# Patient Record
Sex: Male | Born: 2000 | Race: White | Hispanic: No | Marital: Single | Smoking: Never smoker
Health system: Southern US, Community
[De-identification: ages and names within clinical notes are randomized; demographics above are authoritative.]

## PROBLEM LIST (undated history)

## (undated) DIAGNOSIS — F909 Attention-deficit hyperactivity disorder, unspecified type: Secondary | ICD-10-CM

## (undated) DIAGNOSIS — F840 Autistic disorder: Secondary | ICD-10-CM

## (undated) HISTORY — DX: Autistic disorder: F84.0

## (undated) HISTORY — PX: COSMETIC SURGERY: SHX468

---
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
## 2021-01-03 ENCOUNTER — Other Ambulatory Visit: Payer: Self-pay | Admitting: Pediatrics

## 2021-01-03 DIAGNOSIS — K219 Gastro-esophageal reflux disease without esophagitis: Secondary | ICD-10-CM

## 2021-01-07 ENCOUNTER — Encounter (HOSPITAL_COMMUNITY): Payer: Self-pay

## 2021-01-07 ENCOUNTER — Other Ambulatory Visit: Payer: Self-pay

## 2021-01-07 ENCOUNTER — Ambulatory Visit (HOSPITAL_COMMUNITY)
Admission: EM | Admit: 2021-01-07 | Discharge: 2021-01-07 | Disposition: A | Discharge status: Home / Self Care | Payer: Medicaid Other | Attending: Physician Assistant | Admitting: Physician Assistant

## 2021-01-07 DIAGNOSIS — J069 Acute upper respiratory infection, unspecified: Secondary | ICD-10-CM | POA: Insufficient documentation

## 2021-01-07 DIAGNOSIS — Z20822 Contact with and (suspected) exposure to covid-19: Secondary | ICD-10-CM | POA: Diagnosis not present

## 2021-01-07 DIAGNOSIS — R051 Acute cough: Secondary | ICD-10-CM

## 2021-01-07 DIAGNOSIS — R0981 Nasal congestion: Secondary | ICD-10-CM

## 2021-01-07 DIAGNOSIS — R059 Cough, unspecified: Secondary | ICD-10-CM | POA: Diagnosis present

## 2021-01-07 LAB — POC INFLUENZA A AND B ANTIGEN (URGENT CARE ONLY)
INFLUENZA A ANTIGEN, POC: NEGATIVE
INFLUENZA B ANTIGEN, POC: NEGATIVE

## 2021-01-07 MED ORDER — BENZONATATE 100 MG PO CAPS: 100.0000 mg | ORAL_CAPSULE | Freq: Three times a day (TID) | ORAL | 0 refills | Status: AC

## 2021-01-07 NOTE — Discharge Instructions (Signed)
I believe that you have a virus.  Your flu testing was negative.  We will contact you if your COVID test is positive.  Please remain in isolation until you receive these results.  You can use Tessalon up to 3 times a day as needed for cough.  Use over-the-counter medications such as Tylenol ibuprofen for additional symptom relief.  Make sure you are drinking plenty of fluid.  If you have any worsening symptoms including high fever, severe cough, shortness of breath, nausea/vomiting you need to be reevaluated.  Follow-up with your primary care provider within a week to ensure symptom improvement.

## 2021-01-07 NOTE — ED Triage Notes (Signed)
Pt reports  cough, sneezing, headache and sore throat x 5 days. DayQuil and NyQuil gives no relief.

## 2021-01-07 NOTE — ED Provider Notes (Signed)
[facility]    CSN: [id number] Arrival date & time: 01/07/21  1327      History   Chief Complaint Chief Complaint  Patient presents with   Cough    HPI Kyle Hodge is a 20 y.o. male.   Patient presents today with a 2-day history of URI symptoms.  Reports cough, nasal congestion, headache, sore throat, vomiting.  Denies any chest pain, shortness of breath, nausea, abdominal pain, diarrhea.  Denies any known sick contacts.  Is up-to-date on COVID-19 vaccination but has not had influenza vaccine.  He has not had COVID in the past.  He has tried DayQuil and NyQuil with improvement of symptoms.  Denies any history of asthma or COPD.  Does have a history of allergies but uses as needed over-the-counter medications for symptom management.  He is having difficulty sleeping as result of cough.   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 Authorizing Provider  benzonatate (TESSALON) 100 MG capsule Take 1 capsule (100 mg total) by mouth every 8 (eight) hours. 01/07/21  Yes Zyshawn Bohnenkamp, Noberto Retort, PA-C  buPROPion (WELLBUTRIN XL) 150 MG 24 hr tablet TK 1 T PO D 05/16/17   [provider]  cloNIDine (CATAPRES) 0.2 MG tablet TK 1 T PO D AT 6 PM FOR SLP 09/03/17   [provider]  esomeprazole (NEXIUM) 40 MG capsule TAKE 1 CAPSULE(40 MG) BY MOUTH DAILY 10/10/20   Maree Erie, MD   methylphenidate 36 MG PO CR tablet Take 36 mg by mouth every morning. 10/22/19   [provider]  QUEtiapine (SEROQUEL XR) 400 MG 24 hr tablet SMARTSIG:1 Tablet(s) By Mouth Every Evening 10/22/19   [provider]    Family History Family History  Problem Relation Age of Onset   Seizures Mother    Depression Mother    Seizures Brother        oldest brother   Learning disabilities Brother    Learning disabilities Brother     Social History Social History   Tobacco Use   Smoking status: Never   Smokeless tobacco: Never   Tobacco comments:     foster home 2019  Substance Use Topics   Alcohol use: No    Alcohol/week: 0.0 standard drinks   Drug use: No     Allergies   Benadryl allergy [diphenhydramine] and Other   Review of Systems Review of Systems  Constitutional:  Positive for activity change and fatigue. Negative for appetite change and fever.  HENT:  Positive for congestion, postnasal drip, sinus pain, sneezing and sore throat. Negative for sinus pressure.   Respiratory:  Positive for cough. Negative for shortness of breath.   Cardiovascular:  Negative for chest pain.  Gastrointestinal:  Positive for nausea. Negative for abdominal pain, diarrhea and vomiting.  Musculoskeletal:  Negative for arthralgias and myalgias.  Neurological:  Positive for headaches. Negative for dizziness  and light-headedness.    Physical Exam Triage Vital Signs ED Triage Vitals  Enc Vitals Group     BP 01/07/21 1458 (!) 141/70     Pulse Rate 01/07/21 1458 (!) 103     Resp 01/07/21 1458 18     Temp 01/07/21 1458 98.8 F (37.1 C)     Temp Source 01/07/21 1458 Oral     SpO2 01/07/21 1458 100 %     Weight --      Height --      Head Circumference --      Peak Flow --      Pain Score 01/07/21 1457 9     Pain Loc --      Pain Edu? --      Excl. in GC? --    No data found.  Updated Vital Signs BP (!) 141/70 (BP Location: Left Arm)   Pulse (!) 103   Temp 98.8 F  (37.1 C) (Oral)   Resp 18   SpO2 100%   Visual Acuity Right Eye Distance:   Left Eye Distance:   Bilateral Distance:    Right Eye Near:   Left Eye Near:    Bilateral Near:     Physical Exam Vitals reviewed.  Constitutional:      General: He is awake.     Appearance: Normal appearance. He is well-developed. He is not ill-appearing.     Comments: Very pleasant male appears stated age in no acute distress sitting comfortably in exam room  HENT:     Head: Normocephalic and atraumatic.     Right Ear: Tympanic membrane, ear canal and external ear normal. Tympanic membrane is not erythematous or bulging.     Left Ear: Tympanic membrane, ear canal and external ear normal. Tympanic membrane is not erythematous or bulging.     Nose: Nose normal.     Right Sinus: No maxillary sinus tenderness or frontal sinus tenderness.     Left Sinus: No maxillary sinus tenderness or frontal sinus tenderness.     Mouth/Throat:     Pharynx: Uvula midline. Posterior oropharyngeal erythema present. No oropharyngeal exudate.  Cardiovascular:     Rate and Rhythm: Normal rate and regular rhythm.     Heart sounds: Normal heart sounds, S1 normal and S2 normal. No murmur heard. Pulmonary:     Effort: Pulmonary effort is normal. No accessory muscle usage or respiratory distress.     Breath sounds: Normal breath sounds. No stridor. No wheezing, rhonchi or rales.     Comments: Clear to auscultation bilaterally Abdominal:     General: Bowel sounds are normal.     Palpations: Abdomen is soft.     Tenderness: There is no abdominal tenderness.  Neurological:     Mental Status: He is alert.  Psychiatric:        Behavior: Behavior is cooperative.     UC Treatments / Results  Labs (all labs ordered are listed, but only abnormal results are displayed) Labs Reviewed  SARS CORONAVIRUS 2 (TAT 6-24 HRS)  POC INFLUENZA A AND B ANTIGEN (URGENT CARE ONLY)    EKG   Radiology No results  found.  Procedures Procedures (including critical care time)  Medications Ordered in UC Medications - No data to display  Initial Impression / Assessment and Plan / UC Course  I have reviewed the triage vital signs and the nursing notes.  Pertinent labs & imaging results that were available during my care of the patient were reviewed by  me and considered in my medical decision making (see chart for details).     Likely viral etiology of symptoms given short duration.  No evidence of acute infection on physical exam that would warrant initiation of antibiotics.  Flu testing was negative.  COVID test is pending.  Patient was instructed to remain in isolation until COVID test results are obtained and follow CDC guidelines regarding return to normal activities based on result.  He was given Tessalon for cough.  Recommended use over-the-counter medications for additional symptom relief.  He is to rest and drink plenty of fluid.  Discussed alarm symptoms that warrant emergent evaluation.  Strict return precautions given to which she expressed understanding.  Final Clinical Impressions(s) / UC Diagnoses   Final diagnoses:  Upper respiratory tract infection, unspecified type  Acute cough  Nasal congestion     Discharge Instructions      I believe that you have a virus.  Your flu testing was negative.  We will contact you if your COVID test is positive.  Please remain in isolation until you receive these results.  You can use Tessalon up to 3 times a day as needed for cough.  Use over-the-counter medications such as Tylenol ibuprofen for additional symptom relief.  Make sure you are drinking plenty of fluid.  If you have any worsening symptoms including high fever, severe cough, shortness of breath, nausea/vomiting you need to be reevaluated.  Follow-up with your primary care provider within a week to ensure symptom improvement.     ED Prescriptions     Medication Sig Dispense Auth.  Provider   benzonatate (TESSALON) 100 MG capsule Take 1 capsule (100 mg total) by mouth every 8 (eight) hours. 21 capsule Myriah Boggus K, PA-C      PDMP not reviewed this encounter.   Jeani Hawking, PA-C 01/07/21 1536

## 2021-01-08 LAB — SARS CORONAVIRUS 2 (TAT 6-24 HRS): SARS Coronavirus 2: NEGATIVE

## 2021-01-15 ENCOUNTER — Ambulatory Visit: Payer: Medicaid Other | Admitting: Psychology

## 2021-01-20 ENCOUNTER — Other Ambulatory Visit: Payer: Self-pay | Admitting: Pediatrics

## 2021-01-20 DIAGNOSIS — K219 Gastro-esophageal reflux disease without esophagitis: Secondary | ICD-10-CM

## 2021-01-27 ENCOUNTER — Other Ambulatory Visit: Payer: Self-pay

## 2021-01-27 ENCOUNTER — Ambulatory Visit: Payer: Medicaid Other | Admitting: Psychology

## 2021-03-06 ENCOUNTER — Telehealth: Payer: Self-pay | Admitting: Pediatrics

## 2021-03-06 NOTE — Telephone Encounter (Signed)
Kyle Hodge last seen for healthy lifestyles follow up and discussion of transitioning care to adult practice on 08/15/20.  Placed verification of disability form in Dr Catha Gosselin folder for review/ completion.

## 2021-03-06 NOTE — Telephone Encounter (Signed)
Please call [name] as soon form is ready for pick up @ [phone]

## 2021-03-10 NOTE — Telephone Encounter (Signed)
Completed form copied for medical record scanning, original taken to front desk. Ms. [name] notified.

## 2021-03-12 ENCOUNTER — Ambulatory Visit (INDEPENDENT_AMBULATORY_CARE_PROVIDER_SITE_OTHER): Payer: Medicaid Other | Admitting: Pediatrics

## 2021-03-12 ENCOUNTER — Other Ambulatory Visit: Payer: Self-pay

## 2021-03-12 ENCOUNTER — Encounter: Payer: Self-pay | Admitting: Pediatrics

## 2021-03-12 ENCOUNTER — Other Ambulatory Visit (HOSPITAL_COMMUNITY)
Admission: RE | Admit: 2021-03-12 | Discharge: 2021-03-12 | Disposition: A | Discharge status: Home / Self Care | Payer: Medicaid Other | Source: Ambulatory Visit | Attending: Pediatrics | Admitting: Pediatrics

## 2021-03-12 VITALS — BP 118/70 | Ht 66.34 in | Wt 217.6 lb

## 2021-03-12 DIAGNOSIS — Z113 Encounter for screening for infections with a predominantly sexual mode of transmission: Secondary | ICD-10-CM | POA: Insufficient documentation

## 2021-03-12 DIAGNOSIS — Z68.41 Body mass index (BMI) pediatric, greater than or equal to 95th percentile for age: Secondary | ICD-10-CM | POA: Diagnosis not present

## 2021-03-12 DIAGNOSIS — Z0000 Encounter for general adult medical examination without abnormal findings: Secondary | ICD-10-CM

## 2021-03-12 DIAGNOSIS — E669 Obesity, unspecified: Secondary | ICD-10-CM

## 2021-03-12 DIAGNOSIS — F840 Autistic disorder: Secondary | ICD-10-CM | POA: Diagnosis not present

## 2021-03-12 DIAGNOSIS — Z131 Encounter for screening for diabetes mellitus: Secondary | ICD-10-CM

## 2021-03-12 LAB — POCT RAPID HIV: Rapid HIV, POC: NEGATIVE

## 2021-03-12 NOTE — Patient Instructions (Signed)
Please have your new doctor contact us for your medical records, if needed.  Your vaccines are all in [place], [place] vaccine [place], in case you misplace the printed copy.  I wish all the best in your new home and with new friends! It has been a pleasure being your doctor!

## 2021-03-12 NOTE — Progress Notes (Signed)
Adolescent Well Care Visit Kyle Hodge is a 21 y.o. male who is here for well care. [name] is diagnosed with Autism Spectrum Disorder and is placed in foster care setting with Mr & Mrs [name].    PCP:  Maree Erie, MD   History was provided by the patient and his foster parent, Mrs. [name].  Confidentiality was discussed with the patient and, if applicable, with caregiver as well. Patient's personal or confidential phone number: [phone]   Current Issues: Current concerns include doing well.  He is relocating to an adult group living facility in [place], [place] in February. 6 individuals there and he will have his own bedroom and shared bath.  Nutrition: Nutrition/Eating Behaviors: healthy variety but snacks and large portions.  Likes candy, chips, chips Adequate calcium in diet?: milk in cereal and eats other dairy; "I love cheese" and likes ice cream Supplements/ Vitamins: none  Exercise/ Media: Play any Sports?/ Exercise: nothing regularly but will have regular have a daily exercise requirement Screen Time:  > 2 hours-counseling provided Media Rules or Monitoring?: no  Sleep:  Sleep: electronics off at 10 pm and is asleep by 11 pm.  Up at 8/9 adm and not sleepy; sleeps through the night  Social Screening: Lives with:  foster parents Parental relations:  good Activities, Work, and Chores?: cleans the bathroom, vacuums, washes his clothes, towels, takes out the trash.  Will have responsibility for his room and laundry at the new placement Concerns regarding behavior with peers?  no Stressors of note: no He looked for employment without success after graduation; did brief volunteer work. Hopes new placement will help him find employment.  Education: School Name: has graduated HS   Confidential Social History: Tobacco?  no Secondhand smoke exposure?  no Drugs/ETOH?  no  Sexually Active?  no   Pregnancy Prevention: abstinence  Safe at home, in school &  in relationships?  Yes Safe to self?  Yes   Screenings: Patient has a dental home: yes but this will change Last new glasses March 2022 Last dental visit this month and had good visit; no cavities  The patient completed the Rapid Assessment for Adolescent Preventive Services screening questionnaire and the following topics were identified as risk factors and discussed: exercise  In addition, the following topics were discussed as part of anticipatory guidance healthy eating, exercise, condom use, birth control, screen time, and money management skills . Currently, [name] has access to significant dollars from his SSI and likes to purchase; however, he does not know how to manage his account and will only have a small allowance in his new placement  (SSI will go towards cost of residency in facility).  PHQ-9 completed and results indicated low risk with score of 0; No self-harm ideation.  Physical Exam:  Vitals:   03/12/21 1430  BP: 118/70  Weight: 217 lb 9.6 oz (98.7 kg)  Height: 5' 6.34" (1.685 m)   Wt Readings from Last 3 Encounters:  03/12/21 217 lb 9.6 oz (98.7 kg) (97 %, Z= 1.82)*  08/15/20 180 lb 9.6 oz (81.9 kg) (83 %, Z= 0.96)*  11/22/19 177 lb (80.3 kg) (83 %, Z= 0.94)*   * Growth percentiles are based on CDC (Boys, 2-20 Years) data.    BP 118/70    Ht 5' 6.34" (1.685 m)    Wt 217 lb 9.6 oz (98.7 kg)    BMI 34.76 kg/m  Body mass index: body mass index is 34.76 kg/m. Blood pressure percentiles are not  available for patients who are 18 years or older.  Hearing Screening  Method: Audiometry   500Hz  1000Hz  2000Hz  4000Hz   Right ear 20 20 20 20   Left ear 20 20 20 20    Vision Screening   Right eye Left eye Both eyes  Without correction     With correction 20/25 20/20     General Appearance:   alert, oriented, no acute distress and well nourished  HENT: Normocephalic, no obvious abnormality, conjunctiva clear  Mouth:   Normal appearing teeth, no obvious discoloration,  dental caries, or dental caps  Neck:   Supple; thyroid: no enlargement, symmetric, no tenderness/mass/nodules  Chest Normal male with adiposity at breasts  Lungs:   Clear to auscultation bilaterally, normal work of breathing  Heart:   Regular rate and rhythm, S1 and S2 normal, no murmurs;   Abdomen:   Soft, non-tender, no mass, or organomegaly  GU genitalia not examined  Musculoskeletal:   Tone and strength strong and symmetrical, all extremities               Lymphatic:   No cervical adenopathy  Skin/Hair/Nails:   Skin warm, dry and intact, no rashes, no bruises or petechiae  Neurologic:   Strength, gait, and coordination normal and age-appropriate   Results for orders placed or performed in visit on 03/12/21 (from the past 72 hour(s))  POCT Rapid HIV     Status: Normal   Collection Time: 03/12/21  3:29 PM  Result Value Ref Range   Rapid HIV, POC Negative      Assessment and Plan:   1. Encounter for general adult medical examination without abnormal findings   2. Routine screening for STI (sexually transmitted infection)   3. Obesity peds (BMI >=95 percentile)   4. Autism spectrum disorder      BMI is not appropriate for age; reviewed all with and [name]. [name] has gained significant weight in the past 6-7 months post graduation.  This likely reflects his access to snacks and decreased physical activity. Encouraged healthy lifestyle habits.  [name] states new facility should help with this due to change in spare money for snacks, specific cook at the facility and their planned exercise program.  Hearing screening result:normal Vision screening result: normal with glasses  Counseling provided for seasonal flu and COVID bivalent booster vaccine components. He is not able to get his flu vaccine here due to age and would need to schedule for COVID booster.  Encouraged [name] to assist [name] in scheduling at 05-15-2006 so this can be completed prior to his  relocation to the group living facility.  Attempted to screen for DM due to weight gain; however, [name] became anxious and could not keep finger still (pulled away).  Advise checking with new provider.  Orders Placed This Encounter  Procedures   POCT Rapid HIV   Oval should establish care with adult medical provider in his new community. Discussed records transfer process.  Eliberto Ivory, MD

## 2021-03-16 LAB — URINE CYTOLOGY ANCILLARY ONLY
Chlamydia: NEGATIVE
Comment: NEGATIVE
Comment: NORMAL
Neisseria Gonorrhea: NEGATIVE

## 2021-03-25 ENCOUNTER — Telehealth: Payer: Self-pay

## 2021-03-25 NOTE — Telephone Encounter (Signed)
Completed forms for transition to adult group home securely emailed to Ginger Gooding [email or web address]. originals placed in medical records folder for scanning.

## 2021-04-01 ENCOUNTER — Ambulatory Visit (INDEPENDENT_AMBULATORY_CARE_PROVIDER_SITE_OTHER): Payer: Medicaid Other | Admitting: Psychology

## 2021-04-01 ENCOUNTER — Other Ambulatory Visit: Payer: Self-pay

## 2021-04-01 ENCOUNTER — Encounter: Payer: Self-pay | Admitting: Psychology

## 2021-04-01 DIAGNOSIS — F840 Autistic disorder: Secondary | ICD-10-CM

## 2021-04-01 DIAGNOSIS — F902 Attention-deficit hyperactivity disorder, combined type: Secondary | ICD-10-CM | POA: Diagnosis not present

## 2021-04-01 NOTE — Progress Notes (Signed)
[facility] Counselor/Therapist Progress Note  Patient ID: Kyle Hodge, MRN: [id number],    Date: 04/01/2021  Time Spent: 4:00 - 4:45pm   Treatment Type: Individual Therapy  Met with patient for therapy session.  Patient was at the clinic and session was conducted from therapist's office in person.    Reported Symptoms: Patient was referred for therapy by DSS related to a recent move to therapeutic foster care.   Patient has been in [place] custody since August 2018 but had problems with stealing, alcohol, and drug use in the regular foster care placement.  Patient previously diagnosed with Autism spectrum disorder and ADHD.  Therapy session recommended biweekly to assist with building coping, emotion regulation, and social skills.    Mental Status Exam: Appearance:  Casual, Fairly Groomed, and Neat     Behavior: Appropriate  Motor: Normal  Speech/Language:  Clear and Coherent  Affect: Constricted and Flat  Mood: euthymic  Thought process: normal  Thought content:   WNL  Sensory/Perceptual disturbances:   WNL  Orientation: oriented to person, place, time/date, and situation  Attention: Good  Concentration: Good  Memory: Impaired memory due to [place] of knowledge:  Fair  Insight:   Fair  Judgment:  Fair  Impulse Control: Good and Poor   Risk Assessment: Danger to Self:  No Self-injurious Behavior: No Danger to Others: No Duty to Warn:no Physical Aggression / Violence:No  Access to Firearms a concern: No  Gang Involvement:No   Subjective: Patient reported that he will be leaving his current therapeutic foster care placement and moving into an adult group home by the end of this month.  He will working with the new agency that will be serving him to help him find a job when he gets there.  The home is with 4 other individuals with disabilities, three of which are about his age.  The new location is about one hour away from here so he  would not be able to continue in person sessions at this clinic.  Patient indicated that he would prefer in-person to virtual sessions, so this is likely his last session with this provider.  Patient indicated that he wanted to discuss impulse control strategies as he had a relapse in this area regarding spending on pornography and video games     Interventions: Cognitive Behavioral Therapy - review of impulse control strategies Stop, Calm, and Think before acting.  Emphasis was on thinking about whether the expense is going to be helpful or pleasurable for more than a few moments and whether he has enough money to spend in essentials or future recreational activities.    Diagnosis:Autism spectrum disorder  Attention deficit hyperactivity disorder (ADHD), combined type  Plan: Sessions to be terminated as patient will be seeking and in-person male therapist at his new location.  The option of attending sessions with this provider virtually was offered if patient is not able to find a local therapist.     Treatment plan was previously reviewed with patient and guardian.  Patient and guardian expressed agreement with the goals, objectives, and treatment methods identified in the treatment plan.    Treatment Plan Client Statement of Needs  Patient was referred for therapy by DSS related to a recent move to therapeutic foster care. Patient has been in [place] custody since August 2018 but had problems with stealing, alcohol, and drug use in the  regular foster care placement. Patient previously diagnosed with Autism spectrum disorder and ADHD.Therapy session recommended  biweekly to assist with building coping, emotion regulation, and social skills as part of terms for eventual reunification with family.  Problems Addressed  Attention-Deficit/Hyperactivity Disorder (ADHD), Autism Spectrum Disorder  Goal Demonstrate marked improvement in impulse control.  Increase impulse control through developing  coping skills Target Date: 2021-07-15 Frequency: Biweekly Progress: 80  Adjust to new home and job training with out excess frustration at least 80% of the time Target Date: 2021-08-22  Progress: 0  Rainey Pines, PhD

## 2021-04-01 NOTE — Progress Notes (Signed)
                Dianna Deshler, PhD 

## 2021-04-20 ENCOUNTER — Telehealth: Payer: Self-pay

## 2021-04-20 NOTE — Telephone Encounter (Signed)
Spoke with foster parent Ms. Faison. She is requesting a copy of the last office visit note from January. He is moving to [place] on Friday, so she will need the note before then. Please call [phone] when ready for pickup.

## 2021-04-21 ENCOUNTER — Other Ambulatory Visit: Payer: Self-pay | Admitting: Pediatrics

## 2021-04-21 DIAGNOSIS — K219 Gastro-esophageal reflux disease without esophagitis: Secondary | ICD-10-CM

## 2021-04-23 ENCOUNTER — Other Ambulatory Visit: Payer: Self-pay | Admitting: Pediatrics

## 2021-04-23 DIAGNOSIS — K219 Gastro-esophageal reflux disease without esophagitis: Secondary | ICD-10-CM

## 2021-05-06 ENCOUNTER — Other Ambulatory Visit: Payer: Self-pay | Admitting: Pediatrics

## 2021-05-06 DIAGNOSIS — F902 Attention-deficit hyperactivity disorder, combined type: Secondary | ICD-10-CM

## 2021-06-05 ENCOUNTER — Other Ambulatory Visit: Payer: Self-pay | Admitting: Pediatrics

## 2021-06-05 DIAGNOSIS — K219 Gastro-esophageal reflux disease without esophagitis: Secondary | ICD-10-CM

## 2021-06-05 NOTE — Telephone Encounter (Signed)
Theory needs to establish care with provider in his location.   This was discussed with group home provider when she was at the office last in March or April to have med sheet signed.  They can call this office if he has a provider and only bridge prescription is needed. ?

## 2021-07-04 ENCOUNTER — Other Ambulatory Visit: Payer: Self-pay | Admitting: Pediatrics

## 2021-07-04 DIAGNOSIS — K219 Gastro-esophageal reflux disease without esophagitis: Secondary | ICD-10-CM

## 2021-07-08 ENCOUNTER — Other Ambulatory Visit: Payer: Self-pay | Admitting: Pediatrics

## 2021-07-08 DIAGNOSIS — K219 Gastro-esophageal reflux disease without esophagitis: Secondary | ICD-10-CM
